# Patient Record
Sex: Female | Born: 1995 | Race: Black or African American | Hispanic: No | Marital: Single
Health system: Southern US, Community
[De-identification: ages and names within clinical notes are randomized; demographics above are authoritative.]

## PROBLEM LIST (undated history)

## (undated) DIAGNOSIS — H6690 Otitis media, unspecified, unspecified ear: Secondary | ICD-10-CM

## (undated) DIAGNOSIS — Z9109 Other allergy status, other than to drugs and biological substances: Secondary | ICD-10-CM

## (undated) DIAGNOSIS — J45909 Unspecified asthma, uncomplicated: Secondary | ICD-10-CM

## (undated) HISTORY — PX: OTHER SURGICAL HISTORY: SHX169

---
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
## 2016-01-13 ENCOUNTER — Ambulatory Visit: Payer: Medicaid Other | Admitting: Pediatrics

## 2016-01-15 ENCOUNTER — Ambulatory Visit (INDEPENDENT_AMBULATORY_CARE_PROVIDER_SITE_OTHER): Payer: Medicaid Other | Admitting: Pediatrics

## 2016-01-15 ENCOUNTER — Encounter: Payer: Self-pay | Admitting: Pediatrics

## 2016-01-15 ENCOUNTER — Ambulatory Visit (INDEPENDENT_AMBULATORY_CARE_PROVIDER_SITE_OTHER): Payer: Medicaid Other | Admitting: Licensed Clinical Social Worker

## 2016-01-15 ENCOUNTER — Other Ambulatory Visit: Payer: Self-pay | Admitting: Pediatrics

## 2016-01-15 VITALS — Wt 140.0 lb

## 2016-01-15 DIAGNOSIS — F4323 Adjustment disorder with mixed anxiety and depressed mood: Secondary | ICD-10-CM | POA: Diagnosis not present

## 2016-01-15 DIAGNOSIS — R110 Nausea: Secondary | ICD-10-CM | POA: Diagnosis not present

## 2016-01-15 DIAGNOSIS — N898 Other specified noninflammatory disorders of vagina: Secondary | ICD-10-CM

## 2016-01-15 DIAGNOSIS — L74519 Primary focal hyperhidrosis, unspecified: Secondary | ICD-10-CM | POA: Diagnosis not present

## 2016-01-15 DIAGNOSIS — J329 Chronic sinusitis, unspecified: Secondary | ICD-10-CM

## 2016-01-15 DIAGNOSIS — R0982 Postnasal drip: Secondary | ICD-10-CM

## 2016-01-15 DIAGNOSIS — R61 Generalized hyperhidrosis: Secondary | ICD-10-CM

## 2016-01-15 DIAGNOSIS — F419 Anxiety disorder, unspecified: Secondary | ICD-10-CM | POA: Insufficient documentation

## 2016-01-15 MED ORDER — FLUOXETINE HCL 20 MG PO CAPS
20.0000 mg | ORAL_CAPSULE | Freq: Every day | ORAL | Status: DC
Start: 2016-01-15 — End: 2016-03-11

## 2016-01-15 NOTE — BH Specialist Note (Signed)
Referring Provider:  Dr. Florentina Addison Jordan/Dr. Theadore Nan PCP: Venia Minks, MD Session Time:  11:38 - 12:06 (28 min) Type of Service: Behavioral Health - Individual/Family Interpreter: No.  Interpreter Name & Language: NA   PRESENTING CONCERNS:  Angelica Lam is a 20 y.o. female brought in by her girlfriend. Angelica Lam was referred to [facility] for several symptoms including a host of somatic symptoms (rash that no one else can see, breast growth, sweating) anxiety (GAD 7 elevated) and anger. She prioritizes the sweating as her main focus, as it increases her feelings and also keeps her from living the life she wants (ex wearing a dress without sweating in it).  Angelica Lam verbalized consent to talk freely in front of her girlfriend. Her girlfriend said hello and then had headphones and was absorbed in a cell phone for the entire visit. She didn't react to Angelica Lam crying or might have not noticed.   GOALS ADDRESSED:  Identify barriers to social emotional development Increase knowledge of coping skills including guided imagery  INTERVENTIONS:  Assessed current condition/needs Built rapport Stress managment  ASSESSMENT/OUTCOME:  Angelica Lam is pleasant and talkative. Angelica Lam is holding wads of paper towels in both hands. She is sitting and two wet stains are observed on her thighs (sweat?). She gave a history of her symptoms and coping skills. She prioritized her sweating as the driver of many symptoms and is a main focus for her. Validated and empathy given. She was receptive to education about somatization of stress and also antidepressant medications.   Angelica Lam became tearful when she described talking to her little sister, who is in foster care. She appropriately verbalized her emotions. Angelica Lam denied SI today.  Angelica Lam decided to consult with her doctor about medications that might improve her mood. She stated that her emotions can affect her  physical health. She tried guided imagery with emerging skill (mostly relayed a very happy memory) and was laughing and smiling during retelling.    TREATMENT PLAN:  Angelica Lam will continue to wash her hands in cool water to cool down, looking up things on the internet, and watching tv to relax.  She will try guided imagery at least once a day.  She will write her sister a Angelica Lam and send via "snail mail" and send to foster home. She will ask her doctor about antidepressant medications and this writer will inform care team that she is interested.  She voiced emphatic agreement to this plan and was smiling at the end of this visit.   PLAN FOR NEXT VISIT: Check medication side effects.  Teach additional coping skills.  Consider Body Image workbook for this young lady.  Consider CBT to combat thinking errors.    Scheduled next visit: Feb 10 joint with Dr. [place].  Angelica Lam Behavioral Health Clinician [facility]

## 2016-01-15 NOTE — Progress Notes (Signed)
History was provided by the patient.  Angelica Lam is a 20 y.o. female who is here for multiple complaints.     HPI:    Cell phone [phone]  Mucus draining into mouth Had read about possible side effects zyrtec. Not taking. Worried about breasts growing asymetrically  Bad headaches a couple days ago. Ibuprofen wasn't helping. Now better Intermittent nausea. No emesis.   Throat and mouth feeling dry.   Not sleeping well sometimes  Rash with red dots by ear and on arm.   Hair growth on breasts and abdomen. Removing hair lower abdomen. Has regular periods which come monthly. Sometimes has bad cramps.  Bad sweating. Has tried different deodorants and has gone to dermatology. This is one of the main things she would like fixed   Vaginal drainage. Not malodorous. No thickness. Just looks like mucus. Thinks it may be normal, but asks if can have her urine checked   PHQ-SADS Completed on: 01/15/2016 PHQ-15:  17 GAD-7:  10- moderate anxiety PHQ-9:  7 Reported problems make it somewhat difficult to complete activities of daily functioning.    Physical Exam:  Wt 140 lb (63.504 kg)  LMP 12/30/2015  No blood pressure reading on file for this encounter. Patient's last menstrual period was 12/30/2015.    General:   alert, cooperative, appears stated age and no distress     Skin:   normal  Oral cavity:   lips, mucosa, and tongue normal; teeth and gums normal  Eyes:   sclerae white, pupils equal and reactive  Ears:   normal bilaterally. Left TM partially obscured by wax, but no otitis externa on either side  Nose: clear, no discharge, no nasal flaring  Neck:  supple  Lungs:  clear to auscultation bilaterally  Heart:   regular rate and rhythm, S1, S2 normal, no murmur, click, rub or gallop   Abdomen:  soft, non-tender; bowel sounds normal; no masses,  no organomegaly  GU:  not examined  Extremities:   extremities normal, atraumatic, no cyanosis or edema  Neuro:   normal without focal findings and mental status, speech normal, alert and oriented x3    Assessment/Plan:   1. Anxiety Patient has had multiple vague medical complaints over multiple visits in this office. She does endorse anxiety and has a positive PHQ-SADS today consistent with moderate generalized anxiety. Suspect that anxiety underlies much of her symptomatology. Had her meet with Leta Speller, [facility] today for focused session and will start prozac. Discussed risk of side effects and that it takes 4-6 weeks to start seeing benefit. Discussed black box warning and importance of stopping if any SI. Will follow up and assess for side effects in 2 weeks. Will follow up in 4 weeks to consider dose increase and assess for benefit. Will continue to have joint visits with Leta Speller.  - Patient and/or legal guardian verbally consented to meet with Behavioral Health Clinician about presenting concerns. - Amb ref to Integrated Behavioral Health - FLUoxetine (PROZAC) 20 MG capsule; Take 1 capsule (20 mg total) by mouth at bedtime.  Dispense: 30 capsule; Refill: 0  2. Nausea Suspect related to anxiety. Normal abdominal exam. Lab work for similar complaint in October reassuring  3. Post-nasal drainage Discussed regular use of her flonase. 2 sprays each nare daily  4. Vaginal discharge No abdominal tenderness. Suspect physiologic discharge. 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 Patient interested in more aggressive  treatment such as botox, but will defer that. Treatment for anxiety as above     - Follow-up visit in 2 weeks for recheck, or sooner as needed.    Kristol Almanzar [place], MD [facility] Resident, PGY3 01/15/2016

## 2016-01-15 NOTE — Patient Instructions (Signed)
The best website for information about children is CosmeticsCritic.si. All the information is reliable and up-to-date.   At every age, encourage reading. Reading with your child is one of the best activities you can do. Use the [employer] near your home and borrow new books every week!  Call the main number for clinic [phone] before going to the Emergency Department unless it's a true emergency. For a true emergency, go to the [facility] Emergency Department.  A nurse always answers the main number [phone] and a doctor is always available, even when the clinic is closed.   Clinic is open for sick visits only on Saturday mornings from 8:30AM to 12:30PM. Call first thing on Saturday morning for an appointment.   Websites for Teens  General www.youngwomenshealth.org www.youngmenshealthsite.org www.teenhealthfx.com www.teenhealth.org  Sexual and Reproductive Health www.bedsider.org www.seventeendays.org www.plannedparenthood.org www.[email or web address].si

## 2016-01-18 LAB — GC/CHLAMYDIA PROBE AMP
CT Probe RNA: NOT DETECTED
GC PROBE AMP APTIMA: NOT DETECTED

## 2016-01-24 ENCOUNTER — Ambulatory Visit: Payer: Medicaid Other | Admitting: Licensed Clinical Social Worker

## 2016-01-24 ENCOUNTER — Telehealth: Payer: Self-pay | Admitting: Licensed Clinical Social Worker

## 2016-01-24 ENCOUNTER — Ambulatory Visit: Payer: Medicaid Other | Admitting: Pediatrics

## 2016-01-24 NOTE — Telephone Encounter (Signed)
LVM for Angelica Lam. Acknowledged cancelled appointments for today, 01-24-16 and stated that we can check in briefly on the phone. We need to check in on medication response and side effect monitoring. Asked for call back, she should speak to Natasha Burda.

## 2016-02-09 ENCOUNTER — Encounter (HOSPITAL_COMMUNITY): Payer: Self-pay

## 2016-02-09 ENCOUNTER — Emergency Department (HOSPITAL_COMMUNITY)
Admission: EM | Admit: 2016-02-09 | Discharge: 2016-02-09 | Disposition: A | Discharge status: Home / Self Care | Payer: Medicaid Other | Attending: Emergency Medicine | Admitting: Emergency Medicine

## 2016-02-09 DIAGNOSIS — Z8669 Personal history of other diseases of the nervous system and sense organs: Secondary | ICD-10-CM | POA: Diagnosis not present

## 2016-02-09 DIAGNOSIS — Z79899 Other long term (current) drug therapy: Secondary | ICD-10-CM | POA: Diagnosis not present

## 2016-02-09 DIAGNOSIS — J45909 Unspecified asthma, uncomplicated: Secondary | ICD-10-CM | POA: Insufficient documentation

## 2016-02-09 DIAGNOSIS — J029 Acute pharyngitis, unspecified: Secondary | ICD-10-CM | POA: Diagnosis present

## 2016-02-09 LAB — RAPID STREP SCREEN (MED CTR MEBANE ONLY): Streptococcus, Group A Screen (Direct): NEGATIVE

## 2016-02-09 MED ORDER — ACETAMINOPHEN 325 MG PO TABS
650.0000 mg | ORAL_TABLET | Freq: Once | ORAL | Status: AC
  Administered 2016-02-09: 650 mg via ORAL
  Filled 2016-02-09: qty 2

## 2016-02-09 NOTE — ED Provider Notes (Signed)
CSN: [id number]     Arrival date & time 02/09/16  1128 History  By signing my name below, I, Linna Darner, attest that this documentation has been prepared under the direction and in the presence of non-physician practitioner, Gaylyn Rong, PA-C. Electronically Signed: Linna Darner, Scribe. 02/09/2016. 12:15 PM.    Chief Complaint  Patient presents with  . Sore Throat  . Cough    Patient is a 20 y.o. female presenting with cough. The history is provided by the patient. No language interpreter was used.  Cough    HPI Comments: Angelica Lam is a 20 y.o. female with h/o asthma who presents to the Emergency Department complaining of sudden onset, worsening, sore throat for the last week. She also notes an associated productive cough with mucus for the last couple of days. Pt states that her sore throat has been most severe for the past two days. She also reports that she experiences pain in her chest when she coughs. Pt has tried saltwater, cough drops, and OTC medication with no relief. She endorses that she woke up a couple of days ago with a scratchy, dry throat; she notes that she also felt dehydrated. Pt endorses experiencing rhinorrhea. She also notes bilateral ear pain; she states that her left ear pain is worse and endorses associated ringing in her left ear. She states that she went to a doctor a couple of weeks ago for her ear pain and reports that they did not see anything. Pt has ear tubes; she states that her right ear tube came out when she was around 20 years old. Pt denies being around anyone sick recently. She reports that the triage nurse told her she might have a fever, but she has not measured her own temperature. She denies weakness, numbness, or any other associated symptoms at this time.    Past Medical History  Diagnosis Date  . Asthma   . History of environmental allergies     grass  . Otitis    Past Surgical History  Procedure Laterality Date  . Tubes in  ears     Family History  Problem Relation Age of Onset  . Diabetes Mother   . Sickle cell trait Mother   . Cancer Maternal Aunt     Maybe Leukemia (early 30s?)  . Cancer Maternal Uncle     Maybe Leukemia (early 30s?)  . Sickle cell anemia Sister   . Cancer Maternal Uncle   . Arthritis Maternal Grandmother    Social History  Substance Use Topics  . Smoking status: Passive Smoke Exposure - Never Smoker  . Smokeless tobacco: None     Comment: mom smokes   . Alcohol Use: No   OB History    No data available     Review of Systems  All other systems reviewed and are negative.     Allergies  Other  Home Medications   Prior to Admission medications   Medication Sig Start Date End Date Taking? Authorizing Provider  albuterol (PROVENTIL HFA;VENTOLIN HFA) 108 (90 BASE) MCG/ACT inhaler Inhale 2 puffs into the lungs every 6 (six) hours as needed for wheezing or shortness of breath. 12/23/13   Rodolph Bong, MD  EPINEPHrine (EPIPEN 2-PAK) 0.3 mg/0.3 mL IJ SOAJ injection Inject 0.3 mLs (0.3 mg total) into the muscle once. 10/10/15   Clint Guy, MD  FLUoxetine (PROZAC) 20 MG capsule Take 1 capsule (20 mg total) by mouth at bedtime. 01/15/16   Katherine [id number], MD  fluticasone (FLONASE) 50 MCG/ACT nasal spray Place 2 sprays into both nostrils daily. 10/10/15   Clint Guy, MD  hydrocortisone 2.5 % ointment Apply topically 2 (two) times daily. As needed for mild eczema.  Do not use for more than 1-2 weeks at a time. 10/10/15   Clint Guy, MD  ibuprofen (ADVIL,MOTRIN) 600 MG tablet Take 1 tablet (600 mg total) by mouth every 6 (six) hours as needed for mild pain or cramping. 10/10/15   Clint Guy, MD   BP 128/85 mmHg  Pulse 92  Temp(Src) 99.5 F (37.5 C) (Oral)  Resp 18  SpO2 99%  LMP 01/26/2016 Physical Exam  Constitutional: She is oriented to person, place, and time. She appears well-developed and well-nourished. No distress.  HENT:  Head: Normocephalic and  atraumatic.  Right Ear: No middle ear effusion.  Left Ear: Tympanic membrane normal.  No middle ear effusion.  Mouth/Throat: Uvula is midline. No oropharyngeal exudate or posterior oropharyngeal erythema.  Tympanostomy 2, no erythema No cervical adenopathy No tonsillar exudate, pharyngeal erythema No PTA  Eyes: Conjunctivae are normal. Right eye exhibits no discharge. Left eye exhibits no discharge. No scleral icterus.  Cardiovascular: Normal rate.   Pulmonary/Chest: Effort normal and breath sounds normal.  Neurological: She is alert and oriented to person, place, and time. Coordination normal.  Skin: Skin is warm and dry. No rash noted. She is not diaphoretic. No erythema. No pallor.  Psychiatric: She has a normal mood and affect. Her behavior is normal.  Nursing note and vitals reviewed.   ED Course  Procedures (including critical care time)  DIAGNOSTIC STUDIES: Oxygen Saturation is 99% on RA, normal by my interpretation.    COORDINATION OF CARE: 12:16 PM Will administer Tylenol 650 mg. Will order rapid strep screen. Discussed treatment plan with pt at bedside and pt agreed to plan.   Labs Review Labs Reviewed  RAPID STREP SCREEN (NOT AT [facility])  CULTURE, GROUP A STREP [facility])    Imaging Review No results found. I have personally reviewed and evaluated these lab results as part of my medical decision-making.   EKG Interpretation None      MDM   Final diagnoses:  Viral pharyngitis   Pt with negative strep. Diagnosis of viral pharyngitis. No abx indicated at this time. Discussed that results of strep culture are pending and patient will be informed if positive result and abx will be called in at that time. Discharge with symptomatic tx. No evidence of dehydration. Pt is tolerating secretions. Presentation not concerning for peritonsillar abscess or spread of infection to deep spaces of the throat; patent airway. Specific return precautions discussed. Recommended PCP follow  up. Pt appears safe for discharge.  I personally performed the services described in this documentation, which was scribed in my presence. The recorded information has been reviewed and is accurate.      Lester Kinsman [place], PA-C 02/09/16 1354  Geoffery Lyons, MD 02/09/16 [phone]

## 2016-02-09 NOTE — ED Notes (Signed)
Pt with sore throat and cough x 1 week.  Chest cold.  ? Fever at home.

## 2016-02-09 NOTE — Discharge Instructions (Signed)
Sore Throat A sore throat is a painful, burning, sore, or scratchy feeling of the throat. There may be pain or tenderness when swallowing or talking. You may have other symptoms with a sore throat. These include coughing, sneezing, fever, or a swollen neck. A sore throat is often the first sign of another sickness. These sicknesses may include a cold, flu, strep throat, or an infection called mono. Most sore throats go away without medical treatment.  HOME CARE   Only take medicine as told by your doctor.  Drink enough fluids to keep your pee (urine) clear or pale yellow.  Rest as needed.  Try using throat sprays, lozenges, or suck on hard candy (if older than 4 years or as told).  Sip warm liquids, such as broth, herbal tea, or warm water with honey. Try sucking on frozen ice pops or drinking cold liquids.  Rinse the mouth (gargle) with salt water. Mix 1 teaspoon salt with 8 ounces of water.  Do not smoke. Avoid being around others when they are smoking.  Put a humidifier in your bedroom at night to moisten the air. You can also turn on a hot shower and sit in the bathroom for 5-10 minutes. Be sure the bathroom door is closed. GET HELP RIGHT AWAY IF:   You have trouble breathing.  You cannot swallow fluids, soft foods, or your spit (saliva).  You have more puffiness (swelling) in the throat.  Your sore throat does not get better in 7 days.  You feel sick to your stomach (nauseous) and throw up (vomit).  You have a fever or lasting symptoms for more than 2-3 days.  You have a fever and your symptoms suddenly get worse. MAKE SURE YOU:   Understand these instructions.  Will watch your condition.  Will get help right away if you are not doing well or get worse.   This information is not intended to replace advice given to you by your health care provider. Make sure you discuss any questions you have with your health care provider.   Document Released: 09/08/2008 Document  Revised: 08/24/2012 Document Reviewed: 08/07/2012 Elsevier Interactive Patient Education 2016 Elsevier Inc.  Pharyngitis Pharyngitis is redness, pain, and swelling (inflammation) of your pharynx.  CAUSES  Pharyngitis is usually caused by infection. Most of the time, these infections are from viruses (viral) and are part of a cold. However, sometimes pharyngitis is caused by bacteria (bacterial). Pharyngitis can also be caused by allergies. Viral pharyngitis may be spread from person to person by coughing, sneezing, and personal items or utensils (cups, forks, spoons, toothbrushes). Bacterial pharyngitis may be spread from person to person by more intimate contact, such as kissing.  SIGNS AND SYMPTOMS  Symptoms of pharyngitis include:   Sore throat.   Tiredness (fatigue).   Low-grade fever.   Headache.  Joint pain and muscle aches.  Skin rashes.  Swollen lymph nodes.  Plaque-like film on throat or tonsils (often seen with bacterial pharyngitis). DIAGNOSIS  Your health care provider will ask you questions about your illness and your symptoms. Your medical history, along with a physical exam, is often all that is needed to diagnose pharyngitis. Sometimes, a rapid strep test is done. Other lab tests may also be done, depending on the suspected cause.  TREATMENT  Viral pharyngitis will usually get better in 3-4 days without the use of medicine. Bacterial pharyngitis is treated with medicines that kill germs (antibiotics).  HOME CARE INSTRUCTIONS   Drink enough water and fluids to  keep your urine clear or pale yellow.   Only take over-the-counter or prescription medicines as directed by your health care provider:   If you are prescribed antibiotics, make sure you finish them even if you start to feel better.   Do not take aspirin.   Get lots of rest.   Gargle with 8 oz of salt water ( tsp of salt per 1 qt of water) as often as every 1-2 hours to soothe your throat.    Throat lozenges (if you are not at risk for choking) or sprays may be used to soothe your throat. SEEK MEDICAL CARE IF:   You have large, tender lumps in your neck.  You have a rash.  You cough up green, yellow-brown, or bloody spit. SEEK IMMEDIATE MEDICAL CARE IF:   Your neck becomes stiff.  You drool or are unable to swallow liquids.  You vomit or are unable to keep medicines or liquids down.  You have severe pain that does not go away with the use of recommended medicines.  You have trouble breathing (not caused by a stuffy nose). MAKE SURE YOU:   Understand these instructions.  Will watch your condition.  Will get help right away if you are not doing well or get worse.   This information is not intended to replace advice given to you by your health care provider. Make sure you discuss any questions you have with your health care provider.   Follow-up with your primary care provider if your symptoms aren't improved. Encourage adequate hydration, drink plenty of fluids. Take Tylenol as needed for pain and fever. Return to the emergency department if you experience severe worsening of your symptoms, difficulty breathing or swallowing, increased fever, rash.

## 2016-02-11 LAB — CULTURE, GROUP A STREP (THRC)

## 2016-03-11 ENCOUNTER — Ambulatory Visit (INDEPENDENT_AMBULATORY_CARE_PROVIDER_SITE_OTHER): Payer: Medicaid Other | Admitting: Pediatrics

## 2016-03-11 ENCOUNTER — Ambulatory Visit (INDEPENDENT_AMBULATORY_CARE_PROVIDER_SITE_OTHER): Payer: Medicaid Other | Admitting: Licensed Clinical Social Worker

## 2016-03-11 VITALS — BP 115/70 | Temp 98.6°F | Wt 139.2 lb

## 2016-03-11 DIAGNOSIS — J3089 Other allergic rhinitis: Secondary | ICD-10-CM | POA: Diagnosis not present

## 2016-03-11 DIAGNOSIS — F419 Anxiety disorder, unspecified: Secondary | ICD-10-CM

## 2016-03-11 DIAGNOSIS — R61 Generalized hyperhidrosis: Secondary | ICD-10-CM

## 2016-03-11 DIAGNOSIS — L74519 Primary focal hyperhidrosis, unspecified: Secondary | ICD-10-CM

## 2016-03-11 MED ORDER — ALUMINUM CHLORIDE 20 % EX SOLN: Freq: Every day | CUTANEOUS | Status: DC

## 2016-03-11 MED ORDER — CETIRIZINE HCL 10 MG PO TABS: 10.0000 mg | ORAL_TABLET | Freq: Every day | ORAL | Status: DC

## 2016-03-11 NOTE — Progress Notes (Signed)
    Subjective:    Angelica Lam is a 20 y.o. female accompanied by self presenting to the clinic today with a chief c/o of rash on her arms, red dots that come & go.No itching, not bothering her. She is very worried about her skin & thinks that she has alergies that are trigering her rash. 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 No h/o fever. She has allergic rhinitis & would like to switch to an oral medication as she does not like the spray form.  She is also very worried about her excessive sweating. She has used Drysol in the past & also some oral meds prescribed by Dermatologist. She is very distressed by the sweating & feels that it is causing her a lot of anxiety. She has anxiety disorder & was started on fluoxetine at the last visit but she did not start the medication. [facility]ren Preston at our clinic.   Review of Systems  Constitutional: Negative for activity change and appetite change.  Skin: Positive for rash.  Psychiatric/Behavioral: The patient is nervous/anxious.        Objective:   Physical Exam  Constitutional: She appears well-developed.  HENT:  Right Ear: External ear normal.  Left Ear: External ear normal.  Mouth/Throat: Oropharynx is clear and moist.  PE tube present in left external canal with some cerumen.  Eyes: Conjunctivae are normal.  Cardiovascular: Normal rate and normal heart sounds.   Pulmonary/Chest: Breath sounds normal.  Abdominal: Soft.  Skin: Rash (fine erythematous maculopapular lesions on chin & few on the arms & a lesion below left pinna) noted.   .BP 115/70 mmHg  Temp(Src) 98.6 F (37 C)  Wt 139 lb 3.2 oz (63.141 kg)  LMP 02/23/2016        Assessment & Plan:  1. Other allergic rhinitis Trial of cetirizine as pt does not like the flonase. This will also help her with pruritis for dry skin - cetirizine (ZYRTEC) 10 MG tablet; Take 1 tablet (10 mg  total) by mouth daily.  Dispense: 30 tablet; Refill: 2  2. Mild eczema Skin care discussed. Use hypoallergic creams.  3. Hyperhydrosis disorder Supportive care discussed - aluminum chloride (DRYSOL) 20 % external solution; Apply topically at bedtime.  Dispense: 60 mL; Refill: 3 Dermatology had dicussed Botox as an option but she is not sure about that & insurance may not cover that. - Ambulatory referral to Dermatology  3. Anxiety [facility] & will need referral to outside agency if patient agrees. She does not want to start Fluoxetine at this time.  The visit lasted for 30 minutes and > 50% of the visit time was spent on counseling regarding the treatment plan and importance of compliance with chosen management options.  Return if symptoms worsen or fail to improve.  Tobey BrideShruti Ambera Fedele, MD

## 2016-03-11 NOTE — Patient Instructions (Addendum)
Melita you can dry applying Drysol solution to your areas of excessive sweating daily at bedtime & then use it 2-3 times a week if there is improvement. It seems like you have been seen by the dermatologist & tried other medications & that may be a good option to return for a follow up.  You can use cetirizine tablet for nasal allergies & that will also help with your skin itching & eczema. Use a good hypoallergic soap & moisturizer- Natural oats are good for the skin, eucerin/veseline lotions are also helpful. You do not have any skin infections

## 2016-03-11 NOTE — BH Specialist Note (Signed)
Referring Provider: Venia MinksSIMHA,SHRUTI VIJAYA, MD Session Time:  11:16 - 11:40 (24 min) Type of Service: Behavioral Health - Individual/Family Interpreter: No.  Interpreter Name & Language: NA # [facility]-June 2017: 1 before today  PRESENTING CONCERNS:  Angelica Lam is a 20 y.o. female brought in by patient. Angel[facility] Lam [facility] somatic complaints, including rashes that "go away" when she comes to the doctor's office and excessive sweating.    GOALS ADDRESSED:  Increase patient's self-awareness, Jaeda entertained the idea today that she has OCD and that some of her problems might be affected by mood issues.    INTERVENTIONS:  Assessed current condition/needs Discussed integrated care Provided psychoeducation on Payzlee's symptoms. Supportive counseling   ASSESSMENT/OUTCOME:  Angelica BeamShaquinta is annoyed today. She wanted more answers about her sweating and other body hair concern. She talks happily about an upcoming cruise and reiterated complaints about sweating. Today, she is also preoccupied with a "fungus" on her finger nails, showing a few yellow marks around where acrylic nails were removed.  Angelica BeamShaquinta shared the following symptoms: increased sweating, self-conscious about smell, washing hands 60 times/day (her report) and showering 4-5 times/day (her report), wearing gloves to touch her own face, mother having OCD, states she is "neater" than her mother. Using an ointment on her nail "fungus," she uses a Qtip for each application for germ fears. 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 She started to complete a Myrna BlazerYale Brown OCD scale (was very elevated) but directed the conversation back to her sweating and insisted on talking about sweating. She was observed to be looking at a peeled orange on this writer's desk and stated that it made her nervous because it was contaminated with germs.    Angelica BeamShaquinta acknowledged that she has many characteristics of OCD but states that she doesn't have time in her life to address symptoms. She acknowledged that stress is not helping her sweating. She was praised for giving feedback and compromised on a plan to address nail "fungus" and sweating. Much supportive counseling given.   TREATMENT PLAN:  Angelica BeamShaquinta states that she "knows she needs to" do something about her symptoms but says she doesn't have time.  She will look for ways to reduce stress, maybe this will help her meet her physical health goals.  She will avoid acrylic nails.  She will be referred to dermatology at her request to address excessive sweating. Tweezer or use Nail on body hair: do not shave! She voiced agreement.    PLAN FOR NEXT VISIT: Would like to engage her in OCD care. Would like her to follow up on Prozac recommendation from another provider.    Scheduled next visit: None at this time.   Khylin Gutridge Jonah Blue Wm Fruchter LCSWA Behavioral Health Clinician [facility]

## 2016-03-14 ENCOUNTER — Encounter: Payer: Self-pay | Admitting: Pediatrics

## 2016-03-14 DIAGNOSIS — J3089 Other allergic rhinitis: Secondary | ICD-10-CM | POA: Insufficient documentation

## 2016-03-25 ENCOUNTER — Telehealth: Payer: Self-pay | Admitting: *Deleted

## 2016-03-25 NOTE — Telephone Encounter (Signed)
Right great toe infected. Left message for her to make an appointment.

## 2016-03-27 ENCOUNTER — Ambulatory Visit (INDEPENDENT_AMBULATORY_CARE_PROVIDER_SITE_OTHER): Payer: Medicaid Other | Admitting: Pediatrics

## 2016-03-27 VITALS — Wt 138.8 lb

## 2016-03-27 DIAGNOSIS — L600 Ingrowing nail: Secondary | ICD-10-CM | POA: Diagnosis not present

## 2016-03-27 DIAGNOSIS — L03011 Cellulitis of right finger: Secondary | ICD-10-CM | POA: Diagnosis not present

## 2016-03-27 DIAGNOSIS — F419 Anxiety disorder, unspecified: Secondary | ICD-10-CM | POA: Diagnosis not present

## 2016-03-27 MED ORDER — CEPHALEXIN 500 MG PO CAPS: 500.0000 mg | ORAL_CAPSULE | Freq: Two times a day (BID) | ORAL | Status: DC

## 2016-03-27 NOTE — Progress Notes (Signed)
History was provided by the patient.  Angelica Lam is a 20 y.o. female who is here for toe.     HPI:     Current illness:  wants to get toe looking better because going to go on cruise. Toenail has been growing laterally on big toe. Looked like there was toenail fungus initially. Then there started to be pain intermittently. Last week went to get toes done and a couple days later, started swelling up. The next day started to have pus on and off for a day or two. Then stopped. Has been soaking feet in baking soda and warm water. Called about coming in because wasn't sure what we do. Yesterday started hurting more, and was hurting bad so came in. No pus today.  This is the first time she has had ingrown toenail Has been trying to cut straight across Requests referral podiatry  Fever: none   Vomiting:none Diarrhea;none Appetite;normal UOP: normal   Also wants to talk about several complaints discussed at last visit with Dr. Wynetta EmerySimha including whether she could have MRSA on skin because glove from patient in hospital with MRSA touched her.   She also wanted to see if her fingernails look normal.   Discussed anxiety and she feels like she is doing better. Does not want to wake to talk to Leta SpellerLauren Preston (in a meeting currently) because she needs to bring her sister's car back home   The following portions of the patient's history were reviewed and updated as appropriate: allergies, current medications, past medical history, past social history, past surgical history and problem list.  Physical Exam:  Wt 138 lb 12.8 oz (62.959 kg)  LMP 02/23/2016  No blood pressure reading on file for this encounter. Patient's last menstrual period was 02/23/2016.    General:   alert, cooperative, appears stated age and no distress     Skin:   normal. Several miniscule dry patches on upper arms bilaterally without erythema. No pustules.  Minimal erythema surrounding right big toenail without  warmth and without purulence  Oral cavity:   moist mucus membranes  Eyes:   sclerae white  Lungs:  clear to auscultation bilaterally  Heart:   regular rate and rhythm, S1, S2 normal, no murmur, click, rub or gallop   Abdomen:  soft  Extremities:   extremities normal, atraumatic, no cyanosis or edema  Neuro:  normal without focal findings, mental status, speech normal, alert and oriented x3 and PERLA    Assessment/Plan:  1. Ingrown right big toenail 2. Paronychia, right Mild paronychia of right big toenail. Will treat with cephalexin. Discussed supportive care. Patient requests referral to podiatry and firm that she would like to go even though this is first episode. - cephALEXin (KEFLEX) 500 MG capsule; Take 1 capsule (500 mg total) by mouth 2 (two) times daily. For seven days  Dispense: 14 capsule; Refill: 0 - Ambulatory referral to Podiatry  3. Anxiety Patient with anxiety and has not wanted to start SSRI. Multiple somatic complaints. Today wanted to discuss again her skin complaints and nails. She does not have any evidence of MRSA and I showed her images of a MRSA abscess so she knows what to watch for. I think treating her anxiety would be helpful. She did not want to wait to see a behavioral health clinician today. Would be beneficial to continue to see provider especially if not interested in medical therapy.   Over 25 minutes spent in care of patient with greater than half  the time spent in direct care and counseling for the above issues.    - Follow-up visit as needed.     Zennie Ayars [place], MD [facility] Pediatrics Resident, PGY3 03/27/2016

## 2016-03-27 NOTE — Patient Instructions (Signed)
For ingrown toe:   Warm soaks Cut toenail straight across Take antibiotic twice a day for 7 days  Come back if toe getting worse and not better Come back if problems taking medicine

## 2017-03-12 ENCOUNTER — Telehealth: Payer: Self-pay | Admitting: *Deleted

## 2017-03-12 NOTE — Telephone Encounter (Signed)
Caller requesting copy of shot record. Printed and placed up front for pick up.

## 2017-03-16 ENCOUNTER — Ambulatory Visit: Payer: Medicaid Other | Admitting: Pediatrics

## 2017-05-05 ENCOUNTER — Ambulatory Visit (INDEPENDENT_AMBULATORY_CARE_PROVIDER_SITE_OTHER): Payer: 59 | Admitting: Clinical

## 2017-05-05 ENCOUNTER — Encounter: Payer: Self-pay | Admitting: Pediatrics

## 2017-05-05 ENCOUNTER — Ambulatory Visit (INDEPENDENT_AMBULATORY_CARE_PROVIDER_SITE_OTHER): Payer: 59 | Admitting: Pediatrics

## 2017-05-05 VITALS — BP 96/54 | HR 66 | Ht 59.0 in | Wt 137.0 lb

## 2017-05-05 DIAGNOSIS — Z68.41 Body mass index (BMI) pediatric, 5th percentile to less than 85th percentile for age: Secondary | ICD-10-CM

## 2017-05-05 DIAGNOSIS — Z113 Encounter for screening for infections with a predominantly sexual mode of transmission: Secondary | ICD-10-CM

## 2017-05-05 DIAGNOSIS — J3089 Other allergic rhinitis: Secondary | ICD-10-CM | POA: Diagnosis not present

## 2017-05-05 DIAGNOSIS — F4323 Adjustment disorder with mixed anxiety and depressed mood: Secondary | ICD-10-CM

## 2017-05-05 DIAGNOSIS — Z0001 Encounter for general adult medical examination with abnormal findings: Secondary | ICD-10-CM

## 2017-05-05 DIAGNOSIS — F419 Anxiety disorder, unspecified: Secondary | ICD-10-CM

## 2017-05-05 DIAGNOSIS — H6592 Unspecified nonsuppurative otitis media, left ear: Secondary | ICD-10-CM | POA: Diagnosis not present

## 2017-05-05 DIAGNOSIS — Z91018 Allergy to other foods: Secondary | ICD-10-CM | POA: Diagnosis not present

## 2017-05-05 DIAGNOSIS — H6091 Unspecified otitis externa, right ear: Secondary | ICD-10-CM | POA: Diagnosis not present

## 2017-05-05 DIAGNOSIS — N946 Dysmenorrhea, unspecified: Secondary | ICD-10-CM

## 2017-05-05 LAB — POCT RAPID HIV: RAPID HIV, POC: NEGATIVE

## 2017-05-05 MED ORDER — FLUTICASONE PROPIONATE 50 MCG/ACT NA SUSP: 2.0000 | Freq: Every day | NASAL | 11 refills | Status: DC

## 2017-05-05 MED ORDER — EPINEPHRINE 0.3 MG/0.3ML IJ SOAJ: 0.3000 mg | Freq: Once | INTRAMUSCULAR | 0 refills | Status: AC

## 2017-05-05 MED ORDER — ALBUTEROL SULFATE HFA 108 (90 BASE) MCG/ACT IN AERS: 2.0000 | INHALATION_SPRAY | Freq: Four times a day (QID) | RESPIRATORY_TRACT | 2 refills | Status: AC | PRN

## 2017-05-05 MED ORDER — CIPROFLOXACIN-DEXAMETHASONE 0.3-0.1 % OT SUSP: 4.0000 [drp] | Freq: Two times a day (BID) | OTIC | 0 refills | Status: DC

## 2017-05-05 NOTE — BH Specialist Note (Signed)
Integrated Behavioral Health Initial Visit  MRN: [id number] Name: Angelica GaussShaquinta N Dubose   Session Start time: 11:49 AM  Session End time: 1220pm Total time: 31 min  Type of Service: Integrated Behavioral Health- Individual/Family Interpretor:No. Interpretor Name and Language: n/a   Warm Hand Off Completed.       SUBJECTIVE: Angelica Lam is a 21 y.o. female accompanied by patient. Patient was referred by Dr. Wynetta EmerySimha for adjustment to stage of life. Patient reports the following symptoms/concerns: anxiety & tress around what to do at this stage in her life about her work, relationships, & transitions Duration of problem: Months; Severity of problem: moderate  OBJECTIVE: Mood: Anxious and Affect: Appropriate Risk of harm to self or others: No plan to harm self or others   LIFE CONTEXT: Family and Social: Lives by herself School/Work: Working full time Self-Care: Trying to go out more Life Changes: Adjusting to working and figuring out what she wants to do since she's not satisfied with her current job  GOALS ADDRESSED: Patient will reduce symptoms of: anxiety and stress and increase knowledge and/or ability of: coping skills and also: Increase healthy adjustment to current life circumstances   INTERVENTIONS: Solution-Focused Strategies, Psychoeducation and/or Health Education and Link to [facility]  Standardized Assessments completed:None for [facility]  ASSESSMENT: Patient currently experiencing anxiety around current phase of life and trying to make decisions for herself. Also experiencing social anxiety, stress around transitioning to adult services, and concerns with menstrual cramps.  Patient may benefit from referral to Adolescent Clinic for reproductive health and further assessment of anxiety symptoms.  Patient wants to pursue behavioral therapies to address anxiety at this time and was given information about psycho therapy that may accept her  current insurance.  Also provided primary care information so she can transition into adult care.  PLAN: 1. Follow up with behavioral health clinician on : Joint visit with Adolescent Clinic 2. Behavioral recommendations:  * Look into biofeedback for therapy * Review information about primary care providers * Utilize positive coping skills that she's learned * Writing down her concerns that she needs to make decisions on  3. Referral(s): [facility] Services (LME/Outside Clinic), Community Resources:  Primary Care and Adolescent Clinic 4. "From scale of 1-10, how likely are you to follow plan?": She agreed to referral to Adolescent Clinic  [address] StreetJasmine P Williams, LCSW

## 2017-05-05 NOTE — Patient Instructions (Addendum)
Goals:  Choose more whole grains, lean protein, low-fat dairy, and fruits/non-starchy vegetables.  Aim for 60 min of moderate physical activity daily.  Limit sugar-sweetened beverages and concentrated sweets.  Limit screen time to less than 2 hours daily.  [address] 5 servings of fruits/vegetables a day 3 meals a day, no meal skipping 2 hours of screen time or less 1 hour of vigorous physical activity Almost no sugar-sweetened beverages or foods     Please start looking for adult practices in the area or family practice as you will be turning 22 next year. Please check your insurance for in network providers.   [facility]  [place]  Address: [address], [place], [place] [address]  Phone: [phone]    Options for therapy:  Integrative Therapies:  OUR PHONE NUMBER IS [phone]  Monday - Thursday 8:00 - 7:00 Friday: 8:00 - 5:00   Fax Number:  [phone]   Address:  [address] [place], [place] [address]     [facility]  Ask for BIOFEEDBACK Therapy  [place], [facility] [place]  Address: [address], [place], [place] [address]  Phone: [phone]

## 2017-05-05 NOTE — Progress Notes (Signed)
Adolescent Well Care Visit Angelica Lam is a 21 y.o. female who is here for well care.    PCP:  Marijo FileSimha, Lorena Clearman V, MD   History was provided by the patient.  Current Issues: Current concerns include  1) Right ear irritation & pain. No ear discharge. Does not swim 2) Menstrual cramps: Significant cramps during cycles. Has tried motrin, tylenol & Midol with no significant relief. Has to leave work sometimes due to cramps. Prev on OCP, does not wish to restart OCP. She had nausea with OCP & did not start [name] when switched to lower estrogen pill. Also very nervous about LARC 3) Anxiety- h/o anxiety & adjustment issues. [facility]. Hesitant to start SSRI in the past. Still has issues with anxiety & would like to see a counselor & maybe consider meds. Has private insurance through work. Completed high school & started work. 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  Nutrition: Nutrition/Eating Behaviors: Eats a variety of foods Adequate calcium in diet?: yes Supplements/ Vitamins: No  Exercise/ Media: Play any Sports?/ Exercise: works out  Sleep:  Sleep: No issues  Social Screening: Lives with: independent, rents an apartment. Prev lived with her girlfriend but not anymore Parental relations:  sees mom sometimes Activities, Work, and Chores?: working at Dialysis center  Menstruation:   Patient's last menstrual period was 05/02/2017 (exact date). Menstrual History: irregular cycles with cramping  Confidential Social History: Tobacco?  no Secondhand smoke exposure?  no Drugs/ETOH?  no  Sexually Active?  yes  But no partner right now. Sexual preference: Lesbian Pregnancy Prevention: none right now.  Safe at home, in school & in relationships?  Yes Safe to self?  Yes   Screenings: Patient has a dental home: yes. Also seen by orthodontics for braces  The patient completed the Rapid Assessment for Adolescent Preventive  Services screening questionnaire and the following topics were identified as risk factors and discussed: healthy eating, exercise, birth control, sexuality and mental health issues  In addition, the following topics were discussed as part of anticipatory guidance tobacco use, marijuana use and drug use.  PHQ-9 completed and results indicated score of 7. Concerns for anxiety & depression  Physical Exam:  Vitals:   05/05/17 1043  BP: (!) 96/54  Pulse: 66  SpO2: 99%  Weight: 137 lb (62.1 kg)  Height: 4\' 11"  (1.499 m)   BP (!) 96/54 (BP Location: Right Arm, Patient Position: Sitting, Cuff Size: Normal)   Pulse 66   Ht 4\' 11"  (1.499 m)   Wt 137 lb (62.1 kg)   LMP 05/02/2017 (Exact Date)   SpO2 99%   BMI 27.67 kg/m  Body mass index: body mass index is 27.67 kg/m. Growth percentile SmartLinks can only be used for patients less than 20 years old.   Hearing Screening   Method: Audiometry   125Hz  250Hz  500Hz  1000Hz  2000Hz  3000Hz  4000Hz  6000Hz  8000Hz   Right ear:   25 25 20  20     Left ear:   20 20 20  20       Visual Acuity Screening   Right eye Left eye Both eyes  Without correction: 10/10 10/10 10/10   With correction:       General Appearance:   alert, oriented, no acute distress  HENT: Normocephalic, no obvious abnormality, conjunctiva clear Right ear canal with erythema & swelling  Mouth:   Normal appearing teeth, no obvious discoloration, dental caries, or dental caps  Neck:   Supple; thyroid: no enlargement, symmetric, no tenderness/mass/nodules  Chest Breast tanner 5  Lungs:   Clear to auscultation bilaterally, normal work of breathing  Heart:   Regular rate and rhythm, S1 and S2 normal, no murmurs;   Abdomen:   Soft, non-tender, no mass, or organomegaly  GU normal female external genitalia, pelvic not performed  Musculoskeletal:   Tone and strength strong and symmetrical, all extremities               Lymphatic:   No cervical adenopathy  Skin/Hair/Nails:   Skin warm,  dry and intact, no rashes, no bruises or petechiae  Neurologic:   Strength, gait, and coordination normal and age-appropriate     Assessment and Plan:   21 y/o F for well visit  Otitis externa of right ear, unspecified chronicity, unspecified type Keep ears dry - ciprofloxacin-dexamethasone (CIPRODEX) otic suspension; Place 4 drops into both ears 2 (two) times daily.  Dispense: 7.5 mL; Refill: 0  Dysmenorrhea Discussed restarting OCP but patient is not interested. Discussed LARC. Will refer to adolescent for consult & then can transition to family medicine  Anxiety Seen by [facility] today. 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 GC/Chlamydia Probe Amp  . POCT Rapid HIV     Transition to [facility] practice for next pe. Can be seen here till age 35 yrs  Venia Minks, MD

## 2017-05-06 LAB — GC/CHLAMYDIA PROBE AMP
CT Probe RNA: NOT DETECTED
GC Probe RNA: NOT DETECTED

## 2017-05-17 ENCOUNTER — Encounter: Payer: Self-pay | Admitting: Pediatrics

## 2017-05-17 ENCOUNTER — Ambulatory Visit (INDEPENDENT_AMBULATORY_CARE_PROVIDER_SITE_OTHER): Payer: 59 | Admitting: Pediatrics

## 2017-05-17 VITALS — Temp 98.7°F | Wt 135.2 lb

## 2017-05-17 DIAGNOSIS — Z3202 Encounter for pregnancy test, result negative: Secondary | ICD-10-CM

## 2017-05-17 DIAGNOSIS — N760 Acute vaginitis: Secondary | ICD-10-CM

## 2017-05-17 DIAGNOSIS — N926 Irregular menstruation, unspecified: Secondary | ICD-10-CM | POA: Diagnosis not present

## 2017-05-17 NOTE — Patient Instructions (Signed)
Jinna, we checked for vaginal infection & STI today to make sure that is not the cause of your irregular bleeding. We will call you with the results & is there is an infection, medications will be sent to the pharmacy. You have an appt with the adolescent clinic next month so please keep that apt.  For your allergies, continue Flonase 2 spray each nostril daily. You can also use Afrin (generic brand is ok) 1-2 sprays twice daily for 3 to 4 days for immediate relief.

## 2017-05-17 NOTE — Progress Notes (Signed)
    Subjective:    Angelica Lam is a 21 y.o. female  presenting to the clinic today with a chief c/o of spotting daily for the past week. LMP: 05/02/17. Pt had cpmpleted 1 week of cycle & then after a week started with the spotting. Only minimal amount in pantyliner. No cramping pain. Also with some clear vaginal discharge- occasional foul smelling. No vaginal irritation or itching. No h/o abdominal pain. No dysuria Pt was seen for a PE on 05/05/17 & GC/Chlam was normal. She had sexual intercourse with her female partner few days prior to the spotting. Does not use oral barriers. No prior h/o STI.   Review of Systems  Constitutional: Negative for activity change, appetite change and fever.  HENT: Positive for congestion.   Respiratory: Negative for shortness of breath and wheezing.   Gastrointestinal: Negative for abdominal pain, diarrhea, nausea and vomiting.  Genitourinary: Positive for menstrual problem and vaginal discharge. Negative for dysuria and vaginal pain.  Skin: Negative for rash.  Neurological: Negative for headaches.  Psychiatric/Behavioral: Negative for sleep disturbance.       Objective:   Physical Exam  Constitutional: She appears well-nourished. No distress.  HENT:  Head: Normocephalic and atraumatic.  Right Ear: External ear normal.  Left Ear: External ear normal.  Mouth/Throat: Oropharynx is clear and moist.  Boggy turbinates  Eyes: Conjunctivae and EOM are normal. Right eye exhibits no discharge. Left eye exhibits no discharge.  Neck: Normal range of motion.  Cardiovascular: Normal rate, regular rhythm and normal heart sounds.   Pulmonary/Chest: No respiratory distress. She has no wheezes. She has no rales.  Abdominal: Soft. She exhibits no distension. There is no tenderness.  Genitourinary:  Genitourinary Comments: Pelvic not done. Pt also declined exam for wet prep swab. Preferred to to self swab.  Skin: Skin is warm and dry. No rash noted.  Nursing  note and vitals reviewed.  .Temp 98.7 F (37.1 C)   Wt 135 lb 3.2 oz (61.3 kg)   LMP 05/02/2017 (Exact Date)   BMI 27.31 kg/m       Assessment & Plan:  Irregular menstruation Will rule out STI - POCT urine pregnancy- negative - GC/Chlamydia Probe Amp - WET PREP BY MOLECULAR PROBE  Avoid excessive cleaning & douching.  Will call with results to determine if needs treatment, Has appt with adolescent pod next month to discuss dysmenorrhea & contraception.  Return if symptoms worsen or fail to improve.  Tobey BrideShruti Tashi Band, MD 05/17/2017 6:35 PM

## 2017-05-18 LAB — POCT URINE PREGNANCY: PREG TEST UR: NEGATIVE

## 2017-05-18 LAB — WET PREP BY MOLECULAR PROBE
CANDIDA SPECIES: NOT DETECTED
Gardnerella vaginalis: NOT DETECTED
Trichomonas vaginosis: NOT DETECTED

## 2017-05-18 LAB — GC/CHLAMYDIA PROBE AMP
CT PROBE, AMP APTIMA: NOT DETECTED
GC Probe RNA: NOT DETECTED

## 2017-05-19 NOTE — Progress Notes (Signed)
Called all numbers provided in pt's chart. No answer and unable to leave a message.

## 2017-05-20 NOTE — Progress Notes (Signed)
Again called all 3 numbers in chart, no answer, no VM.

## 2017-05-20 NOTE — Progress Notes (Signed)
Spoke with patient and given results. Has no questions.

## 2017-06-14 ENCOUNTER — Encounter: Payer: Self-pay | Admitting: Pediatrics

## 2017-06-28 ENCOUNTER — Institutional Professional Consult (permissible substitution): Payer: Self-pay | Admitting: Family

## 2017-08-06 ENCOUNTER — Telehealth: Payer: Self-pay | Admitting: Pediatrics

## 2017-08-06 NOTE — Telephone Encounter (Signed)
Spoke with pt to her know the form is ready to be picked up.

## 2017-08-06 NOTE — Telephone Encounter (Addendum)
Patient needs for to be filled out prior to agility testing with the [facility] Completed by L. Stryffeler and taken to front.

## 2017-08-06 NOTE — Telephone Encounter (Signed)
Pt was asking if the form can be picked up today?

## 2017-08-06 NOTE — Telephone Encounter (Signed)
Patient came in this morning and wanted to know if we are able to sign a form for her to [facility] [employer], I put the form in the green pod to be filled out and sign patient does need it by Monday .

## 2017-09-24 ENCOUNTER — Encounter: Payer: Self-pay | Admitting: Pediatrics

## 2017-10-28 ENCOUNTER — Telehealth: Payer: Self-pay | Admitting: Pediatrics

## 2017-10-28 NOTE — Telephone Encounter (Signed)
Patient called today stating that she would like a call from her PCP. She has several questions about her health and would like to speak to PCP to make sure that it does not affect her being able to go in the [employer]. Please call her at [phone] when available.

## 2017-10-29 ENCOUNTER — Other Ambulatory Visit: Payer: Self-pay | Admitting: Pediatrics

## 2017-11-03 NOTE — Telephone Encounter (Signed)
Please advice patient to come in for a visit if she has several questions so that they can be better addressed. [facility] also [facility] at that visit if she is interested. Thanks!  Tobey BrideShruti Andrez Lieurance, MD Pediatrician [facility] [address], [place] Ph: [phone] Fax: [phone] 11/03/2017 2:15 PM

## 2017-11-05 NOTE — Telephone Encounter (Signed)
Spoke with patient who's main concern is that she used an inhaler in the past and is afraid this might exclude her from [employer]. Other concerns are having to pay a copay to come to visit here, new insurance on the horizon 1/19,  and the possibility she is not currently insured. Suggested she first obtain the [employer] questionaire, confirm insurance benefits, then make appt and bring papers with her. Reinforced that her paperwork submission would likely be followed up by a request for all medical records and that facts need to match. 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 She agrees to this plan. Will rout to Dr Wynetta EmerySimha.

## 2017-11-10 NOTE — Telephone Encounter (Signed)
Noted. Thanks  Tobey BrideShruti Torianna Junio, MD Pediatrician [facility] [address], [place] [address] Ph: [phone] Fax: [phone] 11/10/2017 5:49 PM

## 2018-06-13 ENCOUNTER — Encounter: Payer: Self-pay | Admitting: Licensed Clinical Social Worker

## 2018-06-13 ENCOUNTER — Ambulatory Visit (INDEPENDENT_AMBULATORY_CARE_PROVIDER_SITE_OTHER): Payer: PRIVATE HEALTH INSURANCE | Admitting: Student

## 2018-06-13 ENCOUNTER — Ambulatory Visit: Payer: 59 | Admitting: Student

## 2018-06-13 ENCOUNTER — Encounter: Payer: Self-pay | Admitting: Student

## 2018-06-13 VITALS — BP 110/73 | HR 68 | Ht 59.0 in | Wt 133.6 lb

## 2018-06-13 DIAGNOSIS — J3089 Other allergic rhinitis: Secondary | ICD-10-CM | POA: Diagnosis not present

## 2018-06-13 DIAGNOSIS — Z113 Encounter for screening for infections with a predominantly sexual mode of transmission: Secondary | ICD-10-CM

## 2018-06-13 DIAGNOSIS — E663 Overweight: Secondary | ICD-10-CM | POA: Diagnosis not present

## 2018-06-13 DIAGNOSIS — Z0001 Encounter for general adult medical examination with abnormal findings: Secondary | ICD-10-CM

## 2018-06-13 LAB — POCT RAPID HIV: RAPID HIV, POC: NEGATIVE

## 2018-06-13 MED ORDER — LORATADINE 10 MG PO TABS: 10.0000 mg | ORAL_TABLET | Freq: Every day | ORAL | 11 refills | Status: DC

## 2018-06-13 MED ORDER — FLUTICASONE PROPIONATE 50 MCG/ACT NA SUSP: 1.0000 | Freq: Every day | NASAL | 5 refills | Status: DC

## 2018-06-13 NOTE — Patient Instructions (Addendum)
It is time to transition to adult care.   The contact info for the [facility] [phone]  For your allergies: (you can get these medications over the counter, they are not covered by insurance) - Take claritin (loratidine) 10 mg tablet once daily - Take Flonase (fluticasone) twice daily, 1 spray per nare   Teens need about 9 hours of sleep a night. Younger children need more sleep (10-11 hours a night) and adults need slightly less (7-9 hours each night).  11 Tips to Follow:  1. No caffeine after 3pm: Avoid beverages with caffeine (soda, tea, energy drinks, etc.) especially after 3pm. 2. Don't go to bed hungry: Have your evening meal at least 3 hrs. before going to sleep. It's fine to have a small bedtime snack such as a glass of milk and a few crackers but don't have a big meal. 3. Have a nightly routine before bed: Plan on "winding down" before you go to sleep. Begin relaxing about 1 hour before you go to bed. Try doing a quiet activity such as listening to calming music, reading a book or meditating. 4. Turn off the TV and ALL electronics including video games, tablets, laptops, etc. 1 hour before sleep, and keep them out of the bedroom. 5. Turn off your cell phone and all notifications (new email and text alerts) or even better, leave your phone outside your room while you sleep. Studies have shown that a part of your brain continues to respond to certain lights and sounds even while you're still asleep. 6. Make your bedroom quiet, dark and cool. If you can't control the noise, try wearing earplugs or using a fan to block out other sounds. 7. Practice relaxation techniques. Try reading a book or meditating or drain your brain by writing a list of what you need to do the next day. 8. Don't nap unless you feel sick: you'll have a better night's sleep. 9. Don't smoke, or quit if you do. Nicotine, alcohol, and marijuana can all keep you awake. Talk to your health care  provider if you need help with substance use. 10. Most importantly, wake up at the same time every day (or within 1 hour of your usual wake up time) EVEN on the weekends. A regular wake up time promotes sleep hygiene and prevents sleep problems. 11. Reduce exposure to bright light in the last three hours of the day before going to sleep. Maintaining good sleep hygiene and having good sleep habits lower your risk of developing sleep problems. Getting better sleep can also improve your concentration and alertness. Try the simple steps in this guide. If you still have trouble getting enough rest, make an appointment with your health care provider.

## 2018-06-13 NOTE — Progress Notes (Signed)
Adolescent Well Care Visit Angelica Lam is a 22 y.o. female who is here for well care.     PCP:  Marijo File, MD   History was provided by the patient.  Current Issues: Current concerns include: - bags under eyes, nasal congestion - feeling tired: currently having menses, went to bed late   Nutrition: Nutrition/Eating Behaviors: eating a variety of foods Adequate calcium in diet?: drinks milk, eats yogurt/cheese Supplements/ Vitamins: Women's Multivitamin (does not take every day)  Exercise/ Media: Play any Sports?:  none Exercise:  goes to gym, [employer]  Screen Time:  > 2 hours-counseling provided Media Rules or Monitoring?: no  Sleep:  Sleep: 8-9P-4A  Social Screening: Lives with:  By herself Parental relations:  good relationship with mom, does not speak with dad Activities, Work, and 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  no Stressors of note: yes - about passing Chartered loss adjuster, financial wise   Education: Graduated at early college ~ 3 years ago Works at [employer], dietary   Menstruation:   No LMP recorded. Menstrual History: Regular menses, currently on menses   Patient has a dental home: yes, orthodontist and dentist  Confidential social history: Tobacco?  no Secondhand smoke exposure?  yes, mother smokes, but will do away from her Drugs/ETOH?  no  Sexually Active?  Yes, with girlfriend, uses protection  Pregnancy Prevention: No longer on OCPs  Safe at home, in school & in relationships?  Yes Safe to self?  Yes   Screenings:  The patient completed the Rapid Assessment for Adolescent Preventive Services screening questionnaire and the following topics were identified as risk factors and discussed: healthy eating, exercise, seatbelt use, weapon use, condom use, birth control, sexuality and screen time  In addition, the following topics were discussed as part of anticipatory guidance suicidality/self  harm and mental health issues.  PHQ-9 completed and results indicated some trouble with sleep and energy, no clinical concern for depression  Physical Exam:  Vitals:   06/13/18 1630  BP: 110/73  Pulse: 68  SpO2: 98%  Weight: 133 lb 9.6 oz (60.6 kg)  Height: 4\' 11"  (1.499 m)   BP 110/73   Pulse 68   Ht 4\' 11"  (1.499 m)   Wt 133 lb 9.6 oz (60.6 kg)   SpO2 98%   BMI 26.98 kg/m  Body mass index: body mass index is 26.98 kg/m. Growth percentile SmartLinks can only be used for patients less than 62 years old.   Hearing Screening   Method: Audiometry   125Hz  250Hz  500Hz  1000Hz  2000Hz  3000Hz  4000Hz  6000Hz  8000Hz   Right ear:   20 40 20  20    Left ear:   20 25 20  20       Visual Acuity Screening   Right eye Left eye Both eyes  Without correction: 20/20 20/25   With correction:       Physical Exam  Constitutional: She is oriented to person, place, and time. She appears well-developed and well-nourished. No distress.  HENT:  Head: Normocephalic and atraumatic.  Right Ear: External ear normal.  Left Ear: External ear normal.  Mouth/Throat: Oropharynx is clear and moist. No oropharyngeal exudate.  Boggy, inflamed nasal turbinates. Allergic shiners present under bilateral eyes.   Eyes: Pupils are equal, round, and reactive to light. EOM are normal.  Neck: Normal range of motion. Neck supple.  Cardiovascular: Normal rate and regular rhythm.  No murmur heard. Pulmonary/Chest: Effort normal and breath sounds normal.  No respiratory distress.  Abdominal: Soft. Bowel sounds are normal. She exhibits no distension. There is no tenderness.  Genitourinary:  Genitourinary Comments: Patient declined external vaginal exam. Breast exam normal.  Musculoskeletal: Normal range of motion.  Lymphadenopathy:    She has no cervical adenopathy.  Neurological: She is alert and oriented to person, place, and time. No cranial nerve deficit. She exhibits normal muscle tone. Coordination normal.   Skin: Skin is warm and dry. Capillary refill takes less than 2 seconds. No rash noted.  Psychiatric: She has a normal mood and affect.     Assessment and Plan:  [name] is a 22 year old female that presented for well visit.   1. Encounter for general adult medical examination with abnormal findings Overall, doing well. Concerns for allergic rhinitis as below.  Depression screen okay. Discussed sleep hygiene, mental health, and reproductive health.  Discussed nutrition, exercise, and safety.   Hearing screening result:normal Vision screening result: normal  2. Routine screening for STI (sexually transmitted infection) Sexually active with women, monogamous currently, uses protection  Routine screen - C. trachomatis/N. gonorrhoeae RNA - POCT Rapid HIV  3. Overweight BMI is not appropriate for age  344. Other allergic rhinitis Boggy, inflamed nasal turbinates and allergic shiners bilaterally on exam. Complains of significant congestion, rhinorrhea.  Discussed Flonase, Claritin  - fluticasone (FLONASE) 50 MCG/ACT nasal spray; Place 1 spray into both nostrils daily. 1 spray in each nostril every day  Dispense: 16 g; Refill: 5 - loratadine (CLARITIN) 10 MG tablet; Take 1 tablet (10 mg total) by mouth daily.  Dispense: 30 tablet; Refill: 11    Orders Placed This Encounter  Procedures  . C. trachomatis/N. gonorrhoeae RNA  . POCT Rapid HIV     No follow-ups on file.[place].  Rozella Servello D Juanantonio Stolar, MD

## 2018-06-14 LAB — C. TRACHOMATIS/N. GONORRHOEAE RNA
C. trachomatis RNA, TMA: NOT DETECTED
N. gonorrhoeae RNA, TMA: NOT DETECTED

## 2018-07-18 ENCOUNTER — Ambulatory Visit: Payer: Self-pay

## 2018-09-27 ENCOUNTER — Ambulatory Visit (INDEPENDENT_AMBULATORY_CARE_PROVIDER_SITE_OTHER): Payer: PRIVATE HEALTH INSURANCE | Admitting: Pediatrics

## 2018-09-27 ENCOUNTER — Encounter: Payer: Self-pay | Admitting: Pediatrics

## 2018-09-27 ENCOUNTER — Ambulatory Visit (INDEPENDENT_AMBULATORY_CARE_PROVIDER_SITE_OTHER): Payer: PRIVATE HEALTH INSURANCE | Admitting: Licensed Clinical Social Worker

## 2018-09-27 VITALS — HR 92 | Temp 98.0°F | Ht 59.5 in | Wt 131.5 lb

## 2018-09-27 DIAGNOSIS — F4323 Adjustment disorder with mixed anxiety and depressed mood: Secondary | ICD-10-CM

## 2018-09-27 DIAGNOSIS — H60312 Diffuse otitis externa, left ear: Secondary | ICD-10-CM

## 2018-09-27 DIAGNOSIS — Z7689 Persons encountering health services in other specified circumstances: Secondary | ICD-10-CM | POA: Diagnosis not present

## 2018-09-27 DIAGNOSIS — J301 Allergic rhinitis due to pollen: Secondary | ICD-10-CM | POA: Diagnosis not present

## 2018-09-27 DIAGNOSIS — H6092 Unspecified otitis externa, left ear: Secondary | ICD-10-CM | POA: Insufficient documentation

## 2018-09-27 MED ORDER — CIPROFLOXACIN-DEXAMETHASONE 0.3-0.1 % OT SUSP: 4.0000 [drp] | Freq: Two times a day (BID) | OTIC | 0 refills | Status: AC

## 2018-09-27 MED ORDER — CETIRIZINE HCL 10 MG PO TABS: 10.0000 mg | ORAL_TABLET | Freq: Every day | ORAL | 5 refills | Status: AC

## 2018-09-27 NOTE — BH Specialist Note (Signed)
Integrated Behavioral Health Initial Visit  MRN: [id number] Name: FINDLEY BLANKENBAKER  Number of Integrated Behavioral Health Clinician visits:: 1/6 Session Start time: 5:25 PM   Session End time: 5:36 PM  Total time: 11 minutes  Type of Service: Integrated Behavioral Health- Individual/Family Interpretor:No. Interpretor Name and Language: N/A   Warm Hand Off Completed.       SUBJECTIVE: Angelica Lam is a 22 y.o. female accompanied by Patient Patient was referred by Pixie Casino, NP for sleep concerns. Patient reports the following symptoms/concerns: Not sleeping well, feels like she is tired often, concerned about people commenting that she looks tired. Has tried sleep meditation app with success, not great about putting phone away at night. Discussion about putting phone across the room. 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 Doesn't want to go to [facility]olescent [facility] due to insurance. Life Changes: None reported  GOALS ADDRESSED: Identify barriers to social emotional development and increase awareness of [facility] role in an integrated care model.  INTERVENTIONS: Interventions utilized: Solution-Focused Strategies, Sleep Hygiene and Psychoeducation and/or Health Education  Standardized Assessments completed: Not Needed  ASSESSMENT: Patient currently experiencing sleep concerns and worried affect, had hopes that the NP visit would be more comprehensive.   Patient may benefit from engaging in good health practices related to sleep hygiene and positive self-care.  PLAN: 1. Follow up with behavioral health clinician on : PRN - should go to family medicine in the future. 2. Behavioral  recommendations: Put phone across the room vs. beside. 3. Referral(s): Gave information for family medicine today. 4. "From scale of 1-10, how likely are you to follow plan?": Patient is not pleased with being told to go to family medicine, hopefully she will follow up with them.  Gaetana Michaelis, LCSWA

## 2018-09-27 NOTE — Progress Notes (Signed)
Subjective:    Angelica Lam, is a 22 y.o. female   Chief Complaint  Patient presents with  . underneath eye bags    per pt is getting worse along with ear pain.  Marland Kitchen trouble sleeping    having trouble staying asleep. x2 months   History provider by patient Interpreter: no  HPI:  CMA's notes and vital signs have been reviewed  New Concern #1 Onset of symptoms:   Several concerns today 1. Sleep concerns for the past 2 months - addressed with [facility] counselor, Evelina Dun with recommendations to avoid screen time for 60 minutes prior to sleep and good sleep hygiene Patient given several apps that can help with improving onset of sleep Patient describes that she awakens after sleep and has difficulty falling back asleep  2.  Worried about dark circles under her eyes History of allergic rhinitis - has not been taking zyrtec regularly Eyes sometimes itchy or dry  3.  Ear pain Left ear feels clogged yesterday and having intermittent pain. She has not been swimming.  Medications:  None currently   Review of Systems  Constitutional: Negative.   HENT: Positive for ear pain and postnasal drip.   Eyes: Positive for itching.       Eye lids itching not conjunctival Dark circles under eyes  Respiratory: Negative.   Gastrointestinal: Negative.   Skin: Negative.   Psychiatric/Behavioral: Positive for sleep disturbance.     Patient's history was reviewed and updated as appropriate: allergies, medications, and problem list.       has Acne; Hyperhidrosis; BMI (body mass index), pediatric, 85th to 94th percentile for age, overweight child, prevention plus category; Dysmenorrhea; Chronic otitis externa of left ear; Nasal polyps; Anxiety; Other allergic rhinitis; Irregular menstruation; and Acute vaginitis on their problem list. Objective:     Pulse 92   Temp 98 F (36.7 C) (Temporal)   Ht 4' 11.5" (1.511 m)   Wt 131 lb 8 oz (59.6 kg)   SpO2 99%   BMI 26.12 kg/m    Physical Exam  Constitutional: She appears well-developed and well-nourished. No distress.  HENT:  Head: Normocephalic and atraumatic.  Right Ear: External ear normal.  Nose: Nose normal.  Mouth/Throat: Oropharynx is clear and moist.  Left ear canal swollen and erythematous in distal half of ear, TM pink  Eyes: Conjunctivae and EOM are normal. Right eye exhibits no discharge. Left eye exhibits no discharge.  Neck: Normal range of motion.  Cardiovascular: Normal rate, regular rhythm and normal heart sounds.  Pulmonary/Chest: Effort normal and breath sounds normal. No respiratory distress. She has no wheezes. She has no rales.  Abdominal: She exhibits no distension. There is no tenderness.  Skin: Skin is warm and dry. No rash noted.  Psychiatric: She has a normal mood and affect.  Very talkative, pressured speech at times.  Affect appropriate.  Nursing note and vitals reviewed.    Assessment & Plan:  1. Acute diffuse otitis externa of left ear Discussed diagnosis and treatment plan with parent including medication action, dosing and side effects - ciprofloxacin-dexamethasone (CIPRODEX) OTIC suspension; Place 4 drops into the left ear 2 (two) times daily.  Dispense: 7.5 mL; Refill: 0  2. Non-seasonal allergic rhinitis due to pollen Ear popping and eustachian tube dysfunction explained as part of allergy symptoms and can be helped with antihistamine.   - cetirizine (ZYRTEC) 10 MG tablet; Take 1 tablet (10 mg total) by mouth daily.  Dispense: 30 tablet; Refill: 5  3.  Sleep concern Encouraged to work with apps and consistent bedtime routine.   Given age also encouraging Young adult to transition care to [facility] Medicine practice and contact information given by Evelina Dun who also met with patient tonight to discuss her concerns.  Follow up:  Encouraged to transition care to family practice.  If interim further concerns to meet with PCP.  Satira Mccallum MSN, CPNP, CDE

## 2018-09-27 NOTE — Patient Instructions (Addendum)
The contact info for the [facility] [phone]  They can help with birth control and all your medical care!!   Can ask about nexplanon, IUD, birth control pills.   Mental Health Apps & Websites 2016  Relax Melodies - Soothing sounds  Healthy Minds a.  HealthyMinds is a problem-solving tool to help deal with emotions and cope with the stresses students encounter both on and off campus.  .  MindShift: Tools for anxiety management, from Anxiety  Stop Breathe & Think: Mindfulness for teens a. A friendly, simple tool to guide people of all ages and backgrounds through meditations for mindfulness and compassion.  Smiling Mind: Mindfulness app from [place] (http://smilingmind.com.au/) a. Smiling Mind is a unique Orthoptist developed by a team of psychologists with expertise in youth and adolescent therapy, Mindfulness Meditation and web-based wellness programs   TeamOrange - This is a pretty unique website and app developed by a youth, to support other youth around bullying and stress management     My Life My Voice  a. How are you feeling? This mood journal offers a simple solution for tracking your thoughts, feelings and moods in this interactive tool you can keep right on your phone!  [employer], developed by the [employer] [facility]), is part of Dialectical Behavior Therapy treatment for [employer]. This could be helpful for adolescents with a pending stressful transition such as a move or going off  to college   MY3 (jiezhoufineart.com a. MY3 features a support system, safety plan and resources with the goal of giving clients a tool to use in a time of need. . National Suicide Prevention Lifeline (912) 700-5170.TALK [8255]) and 911 are there to help them.  ReachOut.com (http://us.MenusLocal.com.br) a. ReachOut is an information and support service using evidence based principles and  technology to help teens and  young adults facing tough times and struggling with  mental health issues. All content is written by teens and young adults, for teens  and young adults, to meet them where they are, and help them recognize their  own strengths and use those strengths to overcome their difficulties and/or seek  help if necessary.    Cetirizine for allergies 1 tablet daily  Ciprodex 4 drops to left ear twice daily for next 7 day. Marland Kitchen

## 2018-10-27 ENCOUNTER — Other Ambulatory Visit: Payer: Self-pay

## 2018-10-27 ENCOUNTER — Ambulatory Visit (INDEPENDENT_AMBULATORY_CARE_PROVIDER_SITE_OTHER): Payer: PRIVATE HEALTH INSURANCE | Admitting: Pediatrics

## 2018-10-27 ENCOUNTER — Encounter: Payer: Self-pay | Admitting: Pediatrics

## 2018-10-27 VITALS — Temp 97.2°F | Wt 128.6 lb

## 2018-10-27 DIAGNOSIS — Z23 Encounter for immunization: Secondary | ICD-10-CM | POA: Diagnosis not present

## 2018-10-27 DIAGNOSIS — Z09 Encounter for follow-up examination after completed treatment for conditions other than malignant neoplasm: Secondary | ICD-10-CM | POA: Diagnosis not present

## 2018-10-27 NOTE — Progress Notes (Signed)
   Subjective:     Angelica Lam, is a 22 y.o. female   History provider by patient No interpreter necessary.  Chief Complaint  Patient presents with  . concern over exp to Hep A and mono.    UTD on flushot. requesting bloodwork to r/o. ex-girlfriend dx'd with mono 2 wks ago, and also has hx of Hep A by titer (?)    HPI:  Angelica Lam is a 22 yo female presenting for evaluation after possible exposure to mono and hepatitis A. Thinks she may have been exposed by her ex-girlfriend. Her ex-girlfriend was admitted for Hepatitis A and mononucleosis 3 weeks ago. She has not been sexually active with her in months. She has not seen her ex-girlfreind since she left the hospital 3 weeks ago and did not see her while he was in the hospital. She has no symptoms.   Review of Systems  Constitutional: Negative for activity change, appetite change, fatigue and fever.  HENT: Positive for congestion and rhinorrhea.   Respiratory: Positive for cough.   Gastrointestinal: Negative for abdominal pain, blood in stool, diarrhea, nausea and vomiting.  Genitourinary: Negative for dysuria and vaginal discharge.  Skin: Negative for color change and rash.       No jaundice       Patient's history was reviewed and updated as appropriate: allergies, current medications, past medical history, past social history and problem list.     Objective:     Temp (!) 97.2 F (36.2 C) (Temporal)   Wt 128 lb 9.6 oz (58.3 kg)   LMP 10/26/2018 (Approximate)   BMI 25.54 kg/m   Physical Exam  Constitutional: She appears well-developed and well-nourished. No distress.  HENT:  Nose: Nose normal.  Mouth/Throat: Oropharynx is clear and moist. No oropharyngeal exudate.  Eyes: Conjunctivae and EOM are normal.  Neck: Normal range of motion. Neck supple.  Cardiovascular: Normal rate, regular rhythm and normal heart sounds.  No murmur heard. Pulmonary/Chest: Effort normal and breath sounds normal. No respiratory  distress.  Abdominal: Soft. She exhibits no distension. There is no tenderness.  No hepatosplenomegaly  Skin: Skin is warm. No rash noted.       Assessment & Plan:   Angelica Lam is a 22 y.o. female presenting for evaluation after possible exposure to Hepatitis A and EBV. Exposure occurred greater than 2 weeks ago and therefore Hepatitis A prophylaxis is not indicated, per the Red Book. The patient requests to receive the vaccine any way. She was referred to the Health Department because we do not carry the adult formulation. We discussed signs and symptoms of Hep A and gave return precautions.   1. Need for prophylactic vaccination against hepatitis A  Supportive care and return precautions reviewed.  Return if symptoms worsen or fail to improve.  Angelica SnipesJoane Esterlene Atiyeh, MD

## 2018-10-27 NOTE — Patient Instructions (Signed)
Unfortunately, we do not carry the hepatitis A vaccine for adults in this clinic. You will have to visit the Health Department.  [address], [place]  Clinical Services: 2nd Floor      Fee Office: [phone]     Appointments (English or Spanish): [phone]         Adult Immunizations

## 2019-05-02 ENCOUNTER — Encounter: Payer: Self-pay | Admitting: Pediatrics

## 2019-05-02 ENCOUNTER — Telehealth: Payer: Self-pay

## 2019-05-02 NOTE — Telephone Encounter (Signed)
Letter written & in orange pod box. I however had to mention that she has asthma but also wrote that she has not had any symptoms in the past 5 years. Thanks  Tobey Bride, MD Pediatrician [facility] [address] [place], [place] [address] Ph: [phone] Fax: [phone] 05/02/2019 5:10 PM

## 2019-05-02 NOTE — Telephone Encounter (Signed)
Notified patient that letter was in our front office file drawer. She asked if she could get parts of charting to show "no asthma" and I recommended starting with the letter and putting in a request for virtual visit with MD if needing more information.

## 2019-05-02 NOTE — Telephone Encounter (Signed)
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 Says her past medical record indicates asthma and albuterol use, which can cause delay/difficulty in enlistment. Angelica Lam says she was given albuterol inhaler in 2014 or 2015 but has not needed it in a long time. Asthma is not on [facility] problem list; is noted in ED visit note 12/23/13. Angelica Lam requests letter from PCP indicating that she does not have current problems with asthma.

## 2019-06-20 ENCOUNTER — Telehealth: Payer: Self-pay

## 2019-06-20 NOTE — Telephone Encounter (Signed)
Patient has seen a pulmonologist who has reported patient does not have asthma. Patient plans to bring documentation that states that. Pt has asked if we can update the letter Dr. Derrell Lolling wrote. Explained that pulmonologist letter may be the final statement as he has done testing but that Dr. Derrell Lolling will be notified.

## 2019-06-20 NOTE — Telephone Encounter (Signed)
Note: Patient [place] RN that her PCP is Lauren Jerrell Belfast, PA-C in [place], [place]. That primary is listed on notes printed from Charlaine Dalton ,MD. Route to front desk manager to remove Dr. Derrell Lolling as PCP.

## 2019-06-20 NOTE — Telephone Encounter (Signed)
Patient dropped off reports. First report included testing and stated "diffusing capacity was elevated" and the second stated it was reasonable for patient to move forward with enlisting based upon reports he had access to. RN at [facility] advised patient to give reports to her recruiter.  Copies of reports in Dr. Ilda Basset folder.

## 2019-06-29 ENCOUNTER — Telehealth: Payer: Self-pay

## 2019-06-29 NOTE — Telephone Encounter (Signed)
Pt called stating she would like for Dr. Derrell Lolling to give her a call to review the forms completed by her for the [employer]. I informed her that the nurse and Dr are in communication now and we will get back to her. Phone number is [phone]

## 2019-06-29 NOTE — Telephone Encounter (Signed)
Spoke extensively with [place] regarding pulmonology reports she dropped off last week. Dr. Derrell Lolling has reviewed the reports and they are part of her medical record.   Explained to patient that we cannot change her Dx of asthma and that her pulmonologist and current PCP are the ones who she needs to speak with. She was asking specific wording of past ED visits. Explained that I could not read information to her because it may not be transcribed correctly. Suggested she call HIM and request copies of records.

## 2019-06-30 NOTE — Telephone Encounter (Signed)
Noted. Thank you so much.  Claudean Kinds, MD [place] for [place], [place] [address] Ph: [phone] Fax: [phone] 06/30/2019 12:29 PM

## 2019-07-24 ENCOUNTER — Telehealth: Payer: Self-pay

## 2019-07-24 NOTE — Telephone Encounter (Signed)
Both front office personnel and I spoke extensively with Angelica Lam regarding diagnosis of "anxiety" that appears in medical record; Psychologist, counselling says that this could affect Angelica Lam's eligibility to serve. While diagnosis of anxiety is not on her problem list, it is mentioned in visit with Dr. Martinique 01/15/16 and 2 separate visits with [facility] also in 2017. I suggested that [place] coordinate re-evaluation with her current PCP to be able to determine/document whether this problem has resolved.

## 2019-07-24 NOTE — Telephone Encounter (Signed)
Thank you. This patient has called several times asking for modification of her problems & problem list. That is cannot be done by [place]. She could get a psychological evaluation referred by her PCP & submit that report. Thanks!  Claudean Kinds, MD [facility] [place], [place] [address] Ph: [phone] Fax: [phone] 07/24/2019 1:51 PM

## 2019-10-12 ENCOUNTER — Encounter: Payer: Self-pay | Admitting: Pediatrics
